# Patient Record
Sex: Female | Born: 1945 | Race: White | Hispanic: No | Marital: Single
Health system: Southern US, Community
[De-identification: ages and names within clinical notes are randomized; demographics above are authoritative.]

---

## 2011-08-19 ENCOUNTER — Inpatient Hospital Stay: Payer: Self-pay | Admitting: Internal Medicine

## 2011-08-19 LAB — DRUG SCREEN, URINE
Amphetamines, Ur Screen: NEGATIVE (ref ?–1000)
Barbiturates, Ur Screen: NEGATIVE (ref ?–200)
Cocaine Metabolite,Ur ~~LOC~~: NEGATIVE (ref ?–300)
MDMA (Ecstasy)Ur Screen: NEGATIVE (ref ?–500)
Methadone, Ur Screen: NEGATIVE (ref ?–300)
Opiate, Ur Screen: NEGATIVE (ref ?–300)
Tricyclic, Ur Screen: NEGATIVE (ref ?–1000)

## 2011-08-19 LAB — URINALYSIS, COMPLETE
Blood: NEGATIVE
Glucose,UR: NEGATIVE mg/dL (ref 0–75)
Nitrite: NEGATIVE
Ph: 7 (ref 4.5–8.0)
Specific Gravity: 1.018 (ref 1.003–1.030)
Squamous Epithelial: 1

## 2011-08-19 LAB — COMPREHENSIVE METABOLIC PANEL
Albumin: 3.4 g/dL (ref 3.4–5.0)
Alkaline Phosphatase: 183 U/L — ABNORMAL HIGH (ref 50–136)
Anion Gap: 8 (ref 7–16)
BUN: 12 mg/dL (ref 7–18)
Bilirubin,Total: 3.4 mg/dL — ABNORMAL HIGH (ref 0.2–1.0)
Chloride: 103 mmol/L (ref 98–107)
Co2: 30 mmol/L (ref 21–32)
Creatinine: 0.61 mg/dL (ref 0.60–1.30)
EGFR (African American): >60
EGFR (Non-African Amer.): >60
Glucose: 73 mg/dL (ref 65–99)
Osmolality: 280 (ref 275–301)
Potassium: 4.1 mmol/L (ref 3.5–5.1)
SGOT(AST): 81 U/L — ABNORMAL HIGH (ref 15–37)
SGPT (ALT): 46 U/L
Total Protein: 7.1 g/dL (ref 6.4–8.2)

## 2011-08-19 LAB — ETHANOL: Ethanol: <3 mg/dL

## 2011-08-19 LAB — CBC
HGB: 14 g/dL (ref 12.0–16.0)
MCH: 37.3 pg — ABNORMAL HIGH (ref 26.0–34.0)
MCHC: 34.3 g/dL (ref 32.0–36.0)
MCV: 109 fL — ABNORMAL HIGH (ref 80–100)
Platelet: 104 10*3/uL — ABNORMAL LOW (ref 150–440)
RDW: 15.1 % — ABNORMAL HIGH (ref 11.5–14.5)
WBC: 5 10*3/uL (ref 3.6–11.0)

## 2011-08-19 LAB — AMMONIA: Ammonia, Plasma: 40 mcmol/L — ABNORMAL HIGH (ref 11–32)

## 2011-08-20 LAB — COMPREHENSIVE METABOLIC PANEL
Albumin: 2.6 g/dL — ABNORMAL LOW (ref 3.4–5.0)
Alkaline Phosphatase: 113 U/L (ref 50–136)
Anion Gap: 10 (ref 7–16)
BUN: 8 mg/dL (ref 7–18)
Chloride: 105 mmol/L (ref 98–107)
Creatinine: 0.53 mg/dL — ABNORMAL LOW (ref 0.60–1.30)
Osmolality: 276 (ref 275–301)
SGOT(AST): 63 U/L — ABNORMAL HIGH (ref 15–37)
Sodium: 140 mmol/L (ref 136–145)
Total Protein: 5.6 g/dL — ABNORMAL LOW (ref 6.4–8.2)

## 2011-08-20 LAB — LIPID PANEL
Ldl Cholesterol, Calc: 41 mg/dL (ref 0–100)
Triglycerides: 67 mg/dL (ref 0–200)
VLDL Cholesterol, Calc: 13 mg/dL (ref 5–40)

## 2011-08-21 LAB — PROTIME-INR: INR: 1.5

## 2011-08-22 LAB — HEPATIC FUNCTION PANEL A (ARMC)
Alkaline Phosphatase: 117 U/L (ref 50–136)
SGOT(AST): 50 U/L — ABNORMAL HIGH (ref 15–37)
SGPT (ALT): 27 U/L
Total Protein: 4.7 g/dL — ABNORMAL LOW (ref 6.4–8.2)

## 2011-08-22 LAB — PROTIME-INR
INR: 1.5
Prothrombin Time: 18.7 secs — ABNORMAL HIGH (ref 11.5–14.7)

## 2011-08-22 LAB — AMMONIA: Ammonia, Plasma: 73 mcmol/L — ABNORMAL HIGH (ref 11–32)

## 2011-08-24 LAB — HEPATIC FUNCTION PANEL A (ARMC)
Albumin: 2.3 g/dL — ABNORMAL LOW (ref 3.4–5.0)
Bilirubin, Direct: 1.2 mg/dL — ABNORMAL HIGH (ref 0.00–0.20)
Total Protein: 5.3 g/dL — ABNORMAL LOW (ref 6.4–8.2)

## 2011-08-24 LAB — AMMONIA: Ammonia, Plasma: 29 mcmol/L (ref 11–32)

## 2011-08-24 LAB — PROTIME-INR: INR: 1.5

## 2011-08-26 LAB — CBC WITH DIFFERENTIAL/PLATELET
Basophil %: 0.3 %
Eosinophil #: 0.1 10*3/uL (ref 0.0–0.7)
Eosinophil %: 1.9 %
HCT: 39.5 % (ref 35.0–47.0)
HGB: 13.5 g/dL (ref 12.0–16.0)
Lymphocyte %: 20.3 %
Neutrophil %: 66.5 %
Platelet: 92 10*3/uL — ABNORMAL LOW (ref 150–440)
WBC: 5.3 10*3/uL (ref 3.6–11.0)

## 2011-08-26 LAB — AMMONIA: Ammonia, Plasma: 38 mcmol/L — ABNORMAL HIGH (ref 11–32)

## 2011-08-30 LAB — CBC WITH DIFFERENTIAL/PLATELET
Basophil #: 0 10*3/uL (ref 0.0–0.1)
Basophil %: 0.2 %
Eosinophil #: 0.2 10*3/uL (ref 0.0–0.7)
Eosinophil %: 4.5 %
HCT: 31.9 % — ABNORMAL LOW (ref 35.0–47.0)
HGB: 10.9 g/dL — ABNORMAL LOW (ref 12.0–16.0)
MCH: 37.7 pg — ABNORMAL HIGH (ref 26.0–34.0)
MCV: 110 fL — ABNORMAL HIGH (ref 80–100)
Neutrophil #: 3.3 10*3/uL (ref 1.4–6.5)
Neutrophil %: 61.2 %
RBC: 2.89 10*6/uL — ABNORMAL LOW (ref 3.80–5.20)

## 2011-08-30 LAB — AMMONIA: Ammonia, Plasma: 69 mcmol/L — ABNORMAL HIGH (ref 11–32)

## 2011-08-30 LAB — BASIC METABOLIC PANEL
Calcium, Total: 8.9 mg/dL (ref 8.5–10.1)
Co2: 31 mmol/L (ref 21–32)
EGFR (African American): >60
EGFR (Non-African Amer.): >60
Glucose: 76 mg/dL (ref 65–99)
Osmolality: 282 (ref 275–301)
Potassium: 4.1 mmol/L (ref 3.5–5.1)

## 2011-08-31 LAB — URINALYSIS, COMPLETE
Bilirubin,UR: NEGATIVE
Glucose,UR: NEGATIVE mg/dL (ref 0–75)
Nitrite: NEGATIVE
RBC,UR: 396 /HPF (ref 0–5)
Specific Gravity: 1.028 (ref 1.003–1.030)
Squamous Epithelial: <1

## 2011-09-02 LAB — COMPREHENSIVE METABOLIC PANEL
Albumin: 2.2 g/dL — ABNORMAL LOW (ref 3.4–5.0)
Alkaline Phosphatase: 120 U/L (ref 50–136)
Bilirubin,Total: 2 mg/dL — ABNORMAL HIGH (ref 0.2–1.0)
Calcium, Total: 8.7 mg/dL (ref 8.5–10.1)
Co2: 32 mmol/L (ref 21–32)
EGFR (Non-African Amer.): >60
Glucose: 84 mg/dL (ref 65–99)
Osmolality: 285 (ref 275–301)
SGOT(AST): 63 U/L — ABNORMAL HIGH (ref 15–37)
SGPT (ALT): 34 U/L
Sodium: 144 mmol/L (ref 136–145)

## 2011-09-02 LAB — CBC WITH DIFFERENTIAL/PLATELET
Basophil #: 0 10*3/uL (ref 0.0–0.1)
Eosinophil #: 0.3 10*3/uL (ref 0.0–0.7)
Eosinophil %: 5.6 %
HCT: 31.4 % — ABNORMAL LOW (ref 35.0–47.0)
Lymphocyte #: 1.1 10*3/uL (ref 1.0–3.6)
Lymphocyte %: 22.1 %
MCH: 39.7 pg — ABNORMAL HIGH (ref 26.0–34.0)
MCHC: 35.8 g/dL (ref 32.0–36.0)
MCV: 111 fL — ABNORMAL HIGH (ref 80–100)
Monocyte #: 0.5 10*3/uL (ref 0.0–0.7)
Neutrophil #: 3.1 10*3/uL (ref 1.4–6.5)
RBC: 2.84 10*6/uL — ABNORMAL LOW (ref 3.80–5.20)
RDW: 15.7 % — ABNORMAL HIGH (ref 11.5–14.5)

## 2011-09-04 LAB — AMMONIA: Ammonia, Plasma: 62 mcmol/L — ABNORMAL HIGH (ref 11–32)

## 2012-01-08 ENCOUNTER — Ambulatory Visit: Payer: Self-pay | Admitting: Internal Medicine

## 2012-01-08 ENCOUNTER — Emergency Department: Payer: Self-pay | Admitting: Emergency Medicine

## 2012-01-13 ENCOUNTER — Emergency Department: Payer: Self-pay | Admitting: Emergency Medicine

## 2012-01-22 ENCOUNTER — Emergency Department: Payer: Self-pay | Admitting: Internal Medicine

## 2012-01-27 ENCOUNTER — Inpatient Hospital Stay: Payer: Self-pay | Admitting: Student

## 2012-01-27 LAB — CBC
HCT: 36.8 % (ref 35.0–47.0)
HGB: 12.6 g/dL (ref 12.0–16.0)
MCH: 34.4 pg — ABNORMAL HIGH (ref 26.0–34.0)
MCHC: 34.1 g/dL (ref 32.0–36.0)
MCV: 101 fL — ABNORMAL HIGH (ref 80–100)
Platelet: 121 10*3/uL — ABNORMAL LOW (ref 150–440)
RDW: 15.8 % — ABNORMAL HIGH (ref 11.5–14.5)
WBC: 6.3 10*3/uL (ref 3.6–11.0)

## 2012-01-27 LAB — COMPREHENSIVE METABOLIC PANEL
Albumin: 2.7 g/dL — ABNORMAL LOW (ref 3.4–5.0)
Alkaline Phosphatase: 457 U/L — ABNORMAL HIGH (ref 50–136)
Anion Gap: 8 (ref 7–16)
Calcium, Total: 9 mg/dL (ref 8.5–10.1)
Chloride: 106 mmol/L (ref 98–107)
Co2: 28 mmol/L (ref 21–32)
Creatinine: 0.77 mg/dL (ref 0.60–1.30)
EGFR (African American): >60
EGFR (Non-African Amer.): >60
Glucose: 87 mg/dL (ref 65–99)
Osmolality: 282 (ref 275–301)
Potassium: 3.6 mmol/L (ref 3.5–5.1)
SGPT (ALT): 47 U/L

## 2012-01-27 LAB — AMMONIA: Ammonia, Plasma: 92 mcmol/L — ABNORMAL HIGH (ref 11–32)

## 2012-01-27 LAB — PROTIME-INR
INR: 1.2
Prothrombin Time: 15.9 secs — ABNORMAL HIGH (ref 11.5–14.7)

## 2012-01-28 LAB — URINALYSIS, COMPLETE
Blood: NEGATIVE
Glucose,UR: NEGATIVE mg/dL (ref 0–75)
Ketone: NEGATIVE
Leukocyte Esterase: NEGATIVE
Ph: 6 (ref 4.5–8.0)
RBC,UR: 1 /HPF (ref 0–5)
Squamous Epithelial: 3

## 2012-01-29 LAB — COMPREHENSIVE METABOLIC PANEL
Albumin: 1.9 g/dL — ABNORMAL LOW (ref 3.4–5.0)
Anion Gap: 8 (ref 7–16)
BUN: 8 mg/dL (ref 7–18)
Calcium, Total: 8 mg/dL — ABNORMAL LOW (ref 8.5–10.1)
Chloride: 112 mmol/L — ABNORMAL HIGH (ref 98–107)
Co2: 26 mmol/L (ref 21–32)
Creatinine: 0.72 mg/dL (ref 0.60–1.30)
EGFR (Non-African Amer.): >60
Glucose: 66 mg/dL (ref 65–99)
Osmolality: 287 (ref 275–301)
Potassium: 3.6 mmol/L (ref 3.5–5.1)
SGOT(AST): 53 U/L — ABNORMAL HIGH (ref 15–37)
SGPT (ALT): 31 U/L
Sodium: 146 mmol/L — ABNORMAL HIGH (ref 136–145)
Total Protein: 4.8 g/dL — ABNORMAL LOW (ref 6.4–8.2)

## 2012-01-29 LAB — CBC WITH DIFFERENTIAL/PLATELET
Basophil #: 0 10*3/uL (ref 0.0–0.1)
Basophil %: 0.3 %
Eosinophil %: 4.9 %
HGB: 10.9 g/dL — ABNORMAL LOW (ref 12.0–16.0)
Lymphocyte #: 0.9 10*3/uL — ABNORMAL LOW (ref 1.0–3.6)
MCH: 34.8 pg — ABNORMAL HIGH (ref 26.0–34.0)
MCHC: 34.5 g/dL (ref 32.0–36.0)
Monocyte #: 0.4 x10 3/mm (ref 0.2–0.9)
Monocyte %: 10.8 %
Neutrophil #: 2.6 10*3/uL (ref 1.4–6.5)
Platelet: 98 10*3/uL — ABNORMAL LOW (ref 150–440)
RBC: 3.14 10*6/uL — ABNORMAL LOW (ref 3.80–5.20)
WBC: 4.2 10*3/uL (ref 3.6–11.0)

## 2012-01-29 LAB — AMMONIA: Ammonia, Plasma: 57 mcmol/L — ABNORMAL HIGH (ref 11–32)

## 2012-01-30 LAB — CBC WITH DIFFERENTIAL/PLATELET
Basophil #: 0 10*3/uL (ref 0.0–0.1)
Basophil %: 0.3 %
Eosinophil #: 0.2 10*3/uL (ref 0.0–0.7)
HCT: 32.6 % — ABNORMAL LOW (ref 35.0–47.0)
HGB: 11.1 g/dL — ABNORMAL LOW (ref 12.0–16.0)
Lymphocyte #: 1 10*3/uL (ref 1.0–3.6)
Lymphocyte %: 21.9 %
MCH: 34.3 pg — ABNORMAL HIGH (ref 26.0–34.0)
MCHC: 33.9 g/dL (ref 32.0–36.0)
MCV: 101 fL — ABNORMAL HIGH (ref 80–100)
Neutrophil #: 3 10*3/uL (ref 1.4–6.5)
RBC: 3.22 10*6/uL — ABNORMAL LOW (ref 3.80–5.20)
RDW: 15.7 % — ABNORMAL HIGH (ref 11.5–14.5)
WBC: 4.7 10*3/uL (ref 3.6–11.0)

## 2012-01-31 LAB — BASIC METABOLIC PANEL
Anion Gap: 7 (ref 7–16)
BUN: 7 mg/dL (ref 7–18)
Calcium, Total: 8.2 mg/dL — ABNORMAL LOW (ref 8.5–10.1)
Chloride: 108 mmol/L — ABNORMAL HIGH (ref 98–107)
Co2: 26 mmol/L (ref 21–32)
EGFR (African American): >60
EGFR (Non-African Amer.): >60
Osmolality: 278 (ref 275–301)
Potassium: 4.1 mmol/L (ref 3.5–5.1)
Sodium: 141 mmol/L (ref 136–145)

## 2012-01-31 LAB — TSH: Thyroid Stimulating Horm: 1.62 u[IU]/mL

## 2012-02-07 ENCOUNTER — Ambulatory Visit: Payer: Self-pay | Admitting: Internal Medicine

## 2012-03-07 ENCOUNTER — Ambulatory Visit: Payer: Self-pay | Admitting: Family Medicine

## 2012-03-09 LAB — AMMONIA

## 2012-07-02 ENCOUNTER — Ambulatory Visit: Payer: Self-pay | Admitting: Medical

## 2012-08-09 DEATH — deceased

## 2014-04-07 IMAGING — CT CT HEAD WITHOUT CONTRAST
2 series · 16 of 30 positions shown, 20 images · non-contrast
Comparison: none

REASON FOR EXAM: confused
COMMENTS:

PROCEDURE:     CT  - CT HEAD WITHOUT CONTRAST  - January 27, 2012  [DATE]
RESULT:
HISTORY: Confusion.
Comparison Study: No recent.

[Series 2: without · axial · non-contrast · 0.41mm/px · z∈[+386,+506]mm · 13 of 30 slices shown, 17 images]
[im 3/30  brain]
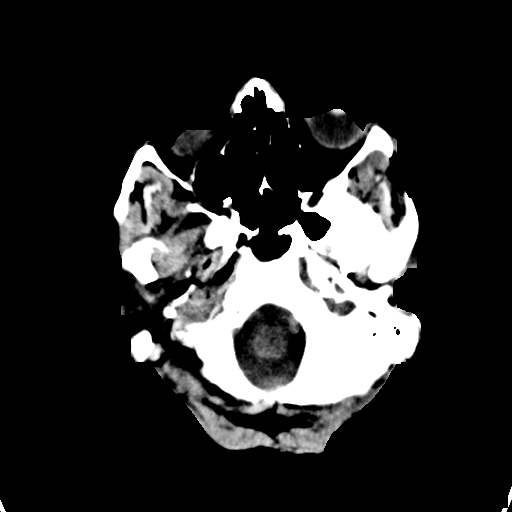
[im 3/30  bone]
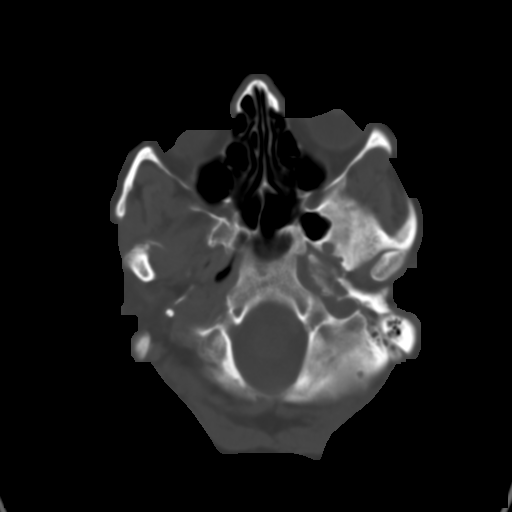
[im 5/30  brain]
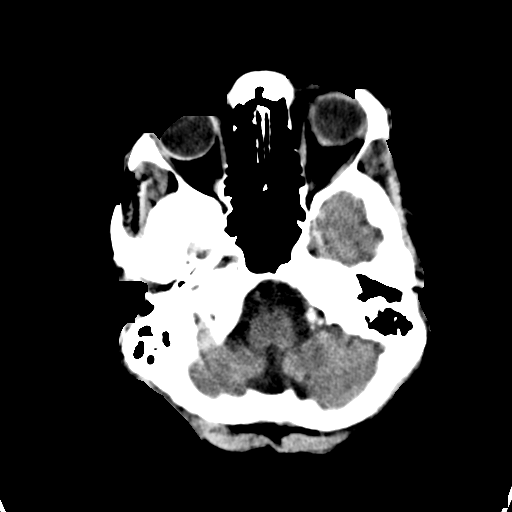
[im 7/30  brain]
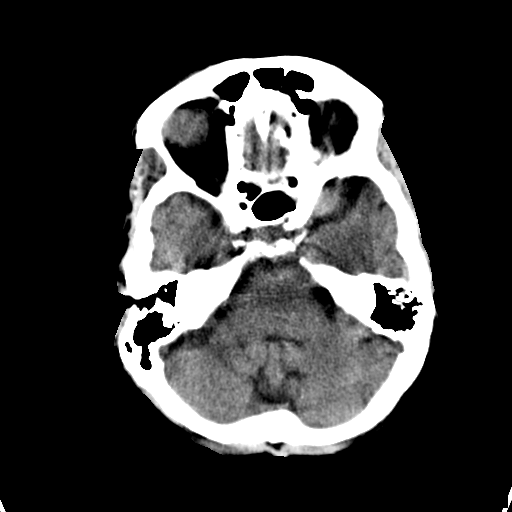
[im 9/30  brain]
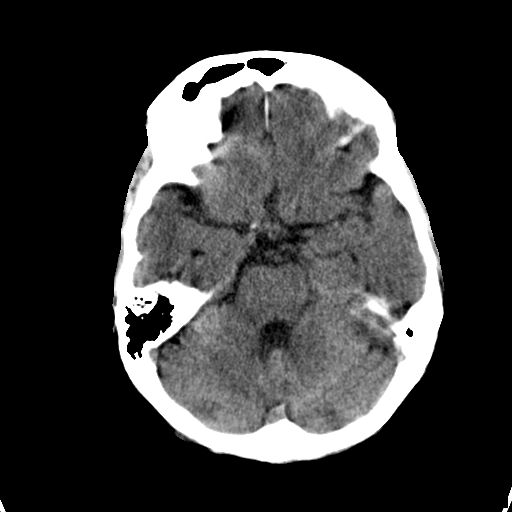
[im 11/30  brain]
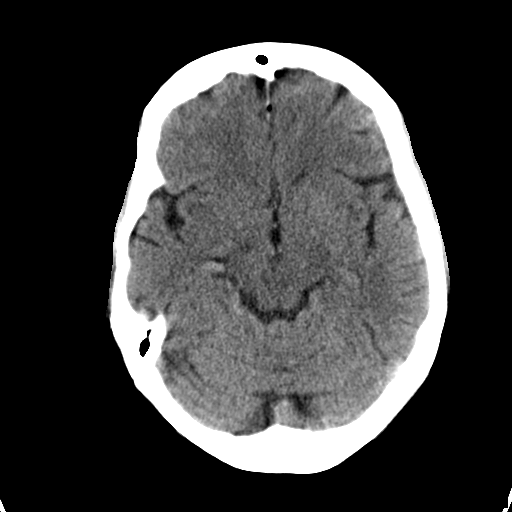
[im 11/30  bone]
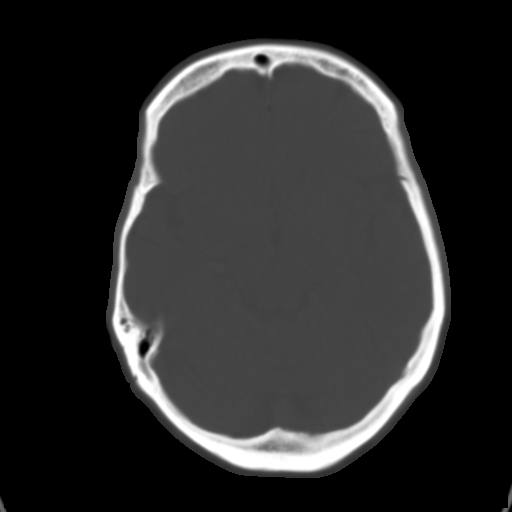
[im 13/30  brain]
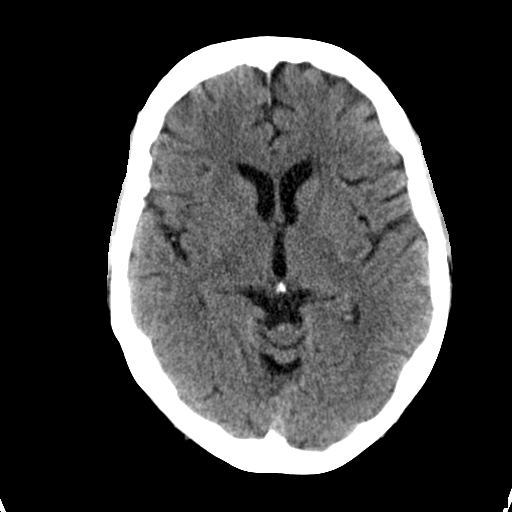
[im 15/30  brain]
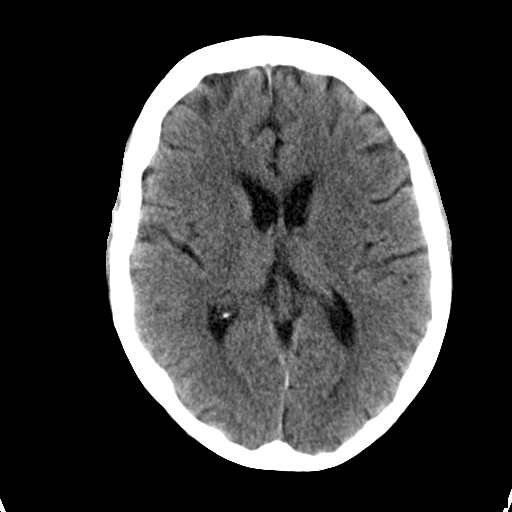
[im 17/30  brain]
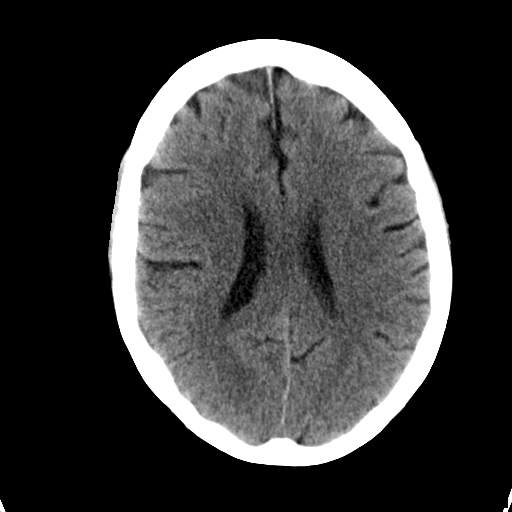
[im 19/30  brain]
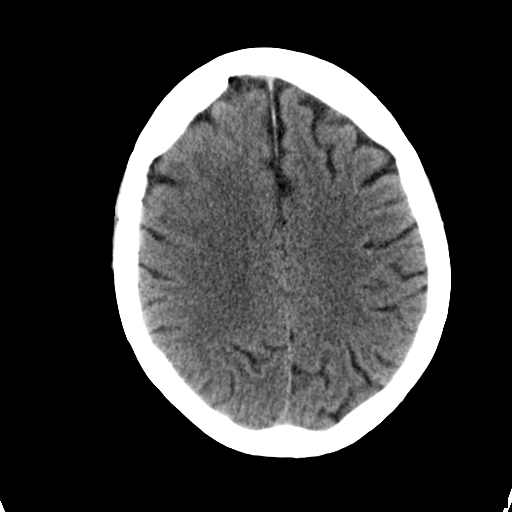
[im 19/30  bone]
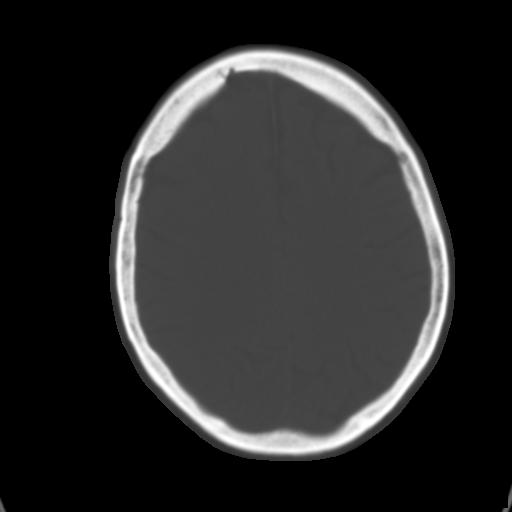
[im 21/30  brain]
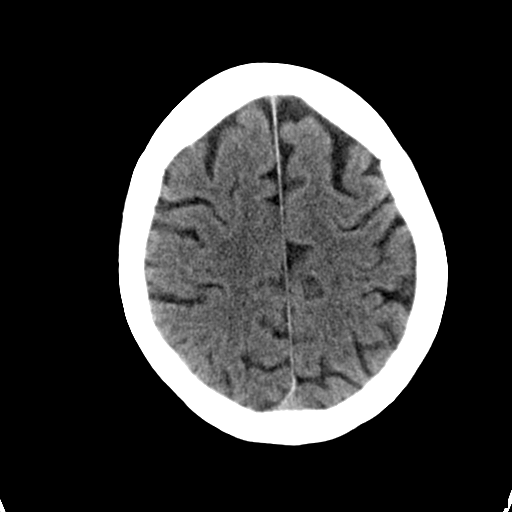
[im 23/30  brain]
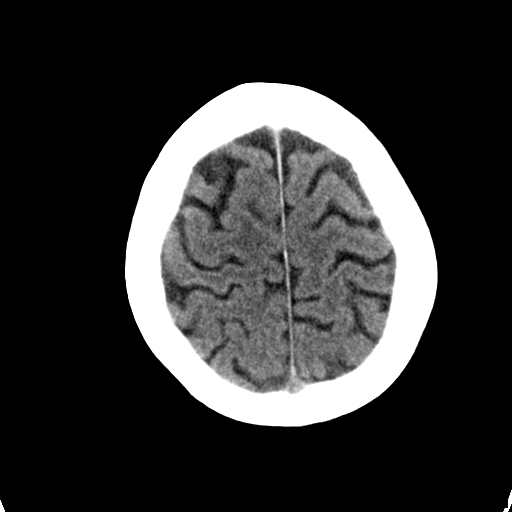
[im 25/30  brain]
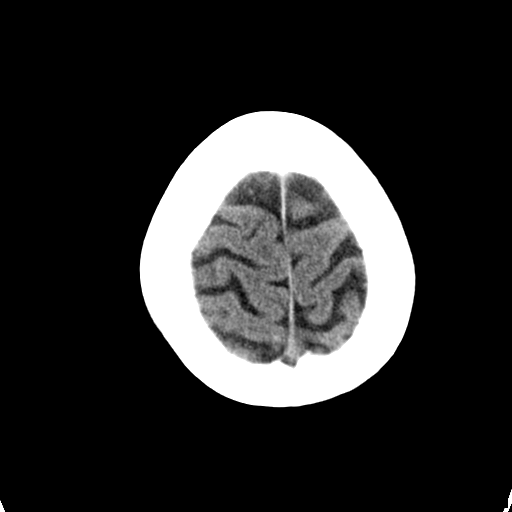
[im 27/30  brain]
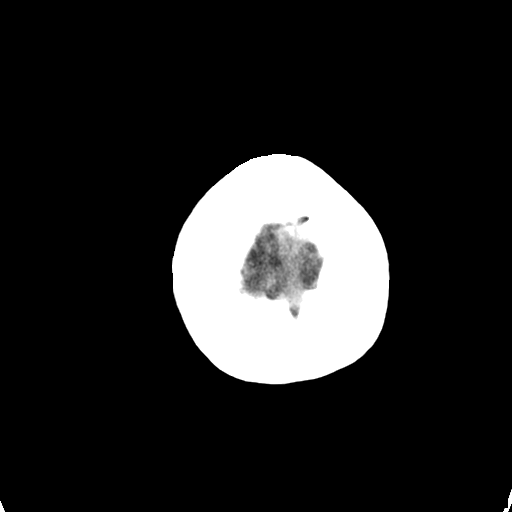
[im 27/30  bone]
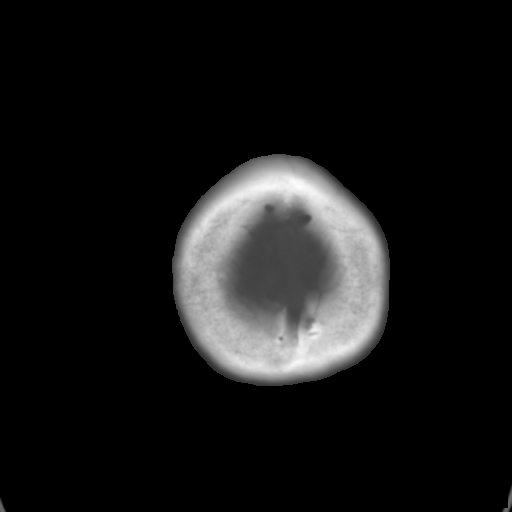

[Series 3: bone · axial · 0.41mm/px · z∈[+386,+426]mm · 3 of 30 slices shown]
[im 3/30  bone]
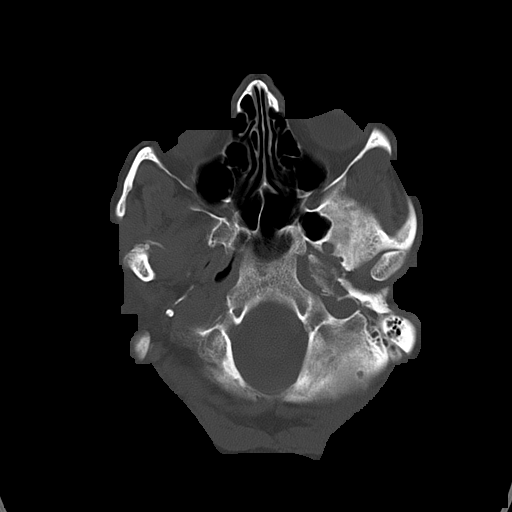
[im 7/30  bone]
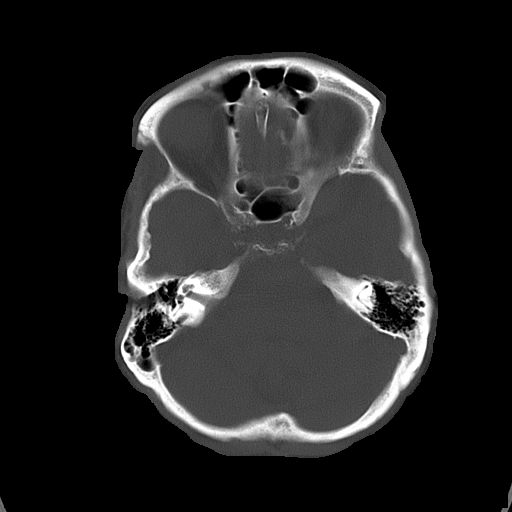
[im 11/30  bone]
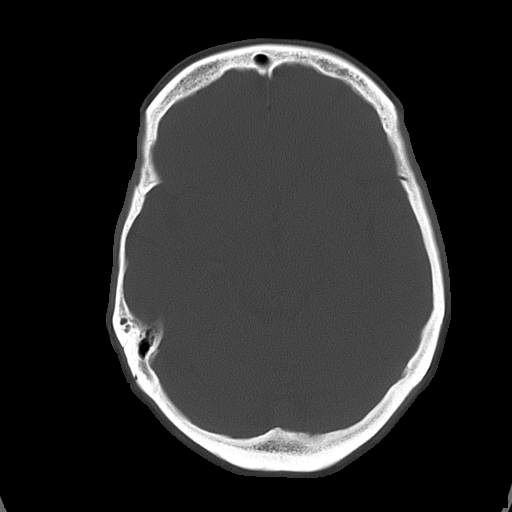

[16 of 30 positions shown; findings below may reference images not displayed]

FINDINGS: Standard nonenhanced CT obtained. No mass. No hydrocephalus. No
hemorrhage. No acute bony abnormality. Head CT is stable from 08/19/2011.
IMPRESSION: No acute abnormality.

## 2014-12-01 NOTE — Consult Note (Signed)
PATIENT NAME:  Cindy Leon, BLACKSTON MR#:  [id number] DATE OF BIRTH:  1946/07/29  DATE OF CONSULTATION:  08/21/2011  REFERRING PHYSICIAN:   CONSULTING PHYSICIAN:  Christena Deem, MD  HISTORY OF PRESENT ILLNESS: Cindy Leon is a 69 year old Caucasian female who was admitted to the hospital on 08/18/2001 with complaints of abdominal pain, confusion, and depression. She has a history of hepatitis C. Interviewing Cindy Leon was very difficult as I believe she is mildly encephalopathic currently and I am uncertain as to whether her facts are correct. There was no family member present. Apparently her sister brought her to the hospital because of complaints of abdominal pain and apparently confusion. She apparently has had hepatitis C for some time, but variably states different intervals. At one point she stated that she has had problems with ammonia levels in the past and the next moment she could not tell me. Her abdominal pain is apparently epigastric and/or right upper quadrant to generalized in nature. She is uncertain whether she has it on a regular basis. There is no nausea or vomiting. She has a bowel movement once or twice a day. She may have been on lactulose at home at one point. She was on Xifaxan 550 mg twice a day apparently as an outpatient. She is unsure of any of her outpatient physicians names. She does deny that she has ever had problems with abdominal swelling/ascites.  LIVER HISTORY: No tattoos. Questionable history of transfusion. 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 She states that she quit in the 1980s. There is no history of military, foreign travel, or incarceration. She states that her brother has a history of some sort of liver disease this sounding very much like chronic hepatitis C. Her father died from complications of alcohol-related liver disease.   PAST MEDICAL HISTORY:  1. History of cervical  cancer. 2. Hepatitis C, as stated.  3. History of hepatic encephalopathy. 4. History of cervical surgeries/removal.   OUTPATIENT MEDICATIONS:  1. Calcium carbonate 500 mg one twice a day. 2. Celexa 10 mg once a day. 3. Oxybutynin 5 mg twice a day. 4. Spironolactone 25 mg once a day. 5. Vitamin D3 once a day. 6. Xifaxan 550 mg twice a day. 7. Zantac 150 mg twice a day.   ALLERGIES: She is allergic to amoxicillin and latex.   REVIEW OF SYSTEMS: Per History and Physical; however, in addition she denies any blood in her stools or black stools, any nausea or vomiting, problems with heartburn or dysphagia.   SOCIAL HISTORY: Currently the patient denies use of alcohol, illicit drugs, or tobacco. She currently lives with her sister who is also taking care of a son who has problems with cancer as well.   PHYSICAL EXAMINATION:   VITAL SIGNS: Temperature 98.6, pulse 74, respirations 18, blood pressure 105/62, and pulse oximetry 94%.   GENERAL: She is a very tired appearing 69 year old Caucasian female in no acute distress. There is some mild altered mental status, as noted above.   HEAD: Normocephalic, atraumatic.   EYES: Mild icterus.   NOSE: Septum midline.   OROPHARYNX: Poor dentition.   NECK: Supple. No JVD.   HEART: Regular rate and rhythm without rub or gallop.   LUNGS: Bilaterally clear.  ABDOMEN: Soft, nondistended. Bowel sounds positive, normoactive. It is difficult to ascertain discomfort to examination. I believe there is a mild generalized discomfort. She relates more so discomfort in  the epigastric region and right upper quadrant then will point to the left upper quadrant and lower abdomen. There is certainly no evidence of rebound or acute abdomen.   RECTAL: Anorectal exam is deferred.   EXTREMITIES: No clubbing, cyanosis, or edema.   NEUROLOGICAL: Cranial nerves II through XII grossly intact. Muscle strength bilaterally equal and symmetric. Deep tendon reflexes  bilaterally equal and symmetric.   LABS/STUDIES: On admission to the hospital labs showed a BUN of 12 and creatinine of 0.61. She had an ethanol of less than 3. Her plasma ammonia on admission was 40 and this on a scale of 11 to 32. Her hepatic profile shows a total protein of 7.1, albumin 3.4, total bilirubin 3.4, alkaline phosphatase 183, AST 81, and ALT 46. She had a single troponin I done at 0.02. Urine drug screen was negative. Her hemogram showed a white count of 5.0, hemoglobin and hematocrit 14.0 and 40.7, platelet count 104, and MCV is 109.   She had a urine culture showing greater than 100,000 organisms with predominance of gram-negative rods.   Her urinalysis however was negative for nitrite and trace leukocyte esterase.   Blood cultures were no growth in 36 hours.   Repeat labs yesterday showed the total protein at 5.6, albumin 2.6, total bilirubin 4.2, alkaline phosphatase 113, AST 63, and ALT 34. I obtained a pro time today that was 18.6 and INR 1.5.   She has had a CT scan of the head without contrast showing no acute intracranial abnormality. There are some chronic changes consistent with chronic small vessel ischemia disease.   Her CT scan of the abdomen and pelvis showed a moderate-sized right hepatic well-circumscribed low attenuation area suggestive of a cyst. Spleen upper limits of normal. The gallbladder is normal. Atrophic uterus. No adnexal masses. Moderate amount of fecal material.   ASSESSMENT:  1. 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 This is mainly by current history. I am unaware as to the extent of her previous liver evaluation as she apparently only recently moved to this area from [place]. She has not been treated for hepatitis C apparently in the past. She presents with manifestations of coagulopathy, hepatic encephalopathy/hyperammonemia as well as abnormal liver testing including hyperbilirubinemia.  She currently does show a mild hepatic encephalopathy.  2. Macrocytosis. This raises a question of B12 or folate deficiency in current setting.  3. Urinary tract infection noted on admission to the hospital, currently on Cipro for this.   RECOMMENDATIONS:  1. Continue current medications. I am going to increase her lactulose to four times a day. She should be having between 2 to 3 bowel movements daily. She has only had one bowel movement since her hospitalization. Hopefully this will help her to clear some of the ammonia and mental status changes.  2. Obtain serum B12, RBC, and folate.  3. In regards to the abnormal CT findings, this is worrisome in the setting of chronic hepatitis C for possible neoplasia in the liver.   We will obtain an alpha-fetoprotein. We will follow with you. ____________________________ Christena DeemMartin U. Ender Rorke, MD mus:slb D: 08/21/2011 16:00:31 ET T: 08/22/2011 09:38:28 ET JOB#: [id number]  cc: Christena DeemMartin U. Kenden Brandt, MD, <Dictator> Christena DeemMARTIN U Myelle Poteat MD ELECTRONICALLY SIGNED 08/31/2011 17:27

## 2014-12-01 NOTE — Consult Note (Signed)
PATIENT NAME:  Cindy Leon, Cindy Leon MR#:  [id number] DATE OF BIRTH:  08-27-1945  DATE OF CONSULTATION:  08/24/2011  REFERRING PHYSICIAN:  Dr. Alwyn Pea  CONSULTING PHYSICIAN:  Adelene Amas. Natalynn Pedone, MD  REASON FOR CONSULTATION: Psychosis, agitation and cloudiness of consciousness.   CHIEF COMPLAINT: See the reason for consultation.   HISTORY OF PRESENT ILLNESS: Ms. Airel Magadan is a 69 year old female admitted to the [facility] on 08/19/2011 for abdominal pain, depressed mood and confusion.   She has a number of factors involving her general medical status including hepatitis C with abdominal pain. She became confused approximately three weeks ago and this progressed to the point of some impairment in memory and some thought disorganization at the time of admission. She also had her relayed to her younger sister that she felt depressed mood.   Other stresses include her son's diagnosis with cancer. She has a history of hepatic encephalopathy with her hepatitis C in the past. Prior to hospitalization Ms. Kraner was relaying to her family that she was having visual hallucinations.   By the day of the undersigned's consultation she had slight clouding of consciousness, however, the most apparent feature is paranoia. Please see the mental status exam. Nurses have reported severe paranoia. The patient has been refusing medication even with her sister trying to facilitate the oral form of medication. She also has displayed severe agitation along with confusion.   PAST PSYCHIATRIC HISTORY: Ms. Gorr does have prior history of hepatic encephalopathy. She has experienced depression in the past and was admitted on Celexa 10 mg daily. But there is no known history of mania or primary psychosis. She has not been psychiatrically hospitalized.    FAMILY PSYCHIATRIC HISTORY: None known.   SOCIAL HISTORY: Ms. Hesse is single. She does not use illegal drugs or alcohol. She has a younger  sister who is very supportive and at the bedside. The patient lives with her younger sister and her son who has cancer. Ms. Grein is unemployed and disabled.   PAST MEDICAL HISTORY:  1. History of cervical cancer. 2. Hepatitis C with history of hepatic encephalopathy. 3. History of surgery on the cervix.   ALLERGIES: Amoxicillin, latex.   MEDICATIONS: Her MAR is reviewed. She is refusing oral medication, however, Celexa 20 mg is on the MAR, Risperdal 1 mg b.i.d. is on the [facility].   LABORATORY, DIAGNOSTIC AND RADIOLOGICAL DATA: SGOT 50, total protein decreased at 4.7, albumin decreased at 2.1 direct bilirubin increased at 0.9. Total bilirubin increased at 1.8, ammonia level increased on 01/13 at 73, RBC folate was unremarkable. B12 unremarkable. Of note her ammonia was 43 on 01/11, possibly showing a significant increase.   Head CT without contrast showed no acute intracranial abnormality. EKG on 01/10 showed the QTc to be 444 ms. Ethanol was negative. BUN and creatinine normal. WBC normal. Hemoglobin normal. Platelet count decreased at 104.    REVIEW OF SYSTEMS: The patient was not able to provide this. Much was gleaned from the medical record; constitutional, HEENT, mouth, neurologic, psychiatric, cardiovascular, respiratory, gastrointestinal, genitourinary, skin, musculoskeletal, hematologic, lymphatic, endocrine, metabolic all unremarkable.   PHYSICAL EXAMINATION:  VITAL SIGNS: Temperature 98, pulse 85, respiratory rate 18, blood pressure 112/58.   GENERAL APPEARANCE: Ms. Tiegs is a mildly cachectic elderly female lying in a supine position in her hospital bed with no abnormal involuntary movements. Her muscle tone appears to be mild to moderately decreased. She is well groomed with normal hygiene.   Her alertness is mildly  decreased. Her eye contact is only intermittent. Her concentration is moderately decreased. When asked about orientation she cannot give place or time. She is oriented to  person. On memory questions she names zero out of three words immediate and zero out of three words at five minutes. Her use of language, intelligence and fund of knowledge are severely impaired. When given the sentence "It is a sunny day in [place]" she could not repeat that sentence.   Her speech is very impoverished. There is no dysarthria. Her phrases are limited to "I don't know". Then she will suddenly burst out with angry affect and say "who are you", "what do you have to do with me" with her affect severely guarded and thought content obviously involving paranoia. She will not provide any additional information. Thought process does appear to be coherent when she is willing to speak. Mood is a mixture of anger and depression. Insight is poor. Judgment is impaired.   ASSESSMENT:  AXIS I:  1. Delirium due to hepatic encephalopathy.  2. Depressive disorder, not otherwise specified. This may be major depression, however, her delirium symptoms are likely strictly due to her general medical condition.   AXIS II: Deferred.   AXIS III: Hepatitis C with hepatic encephalopathy. Please see the above.   AXIS IV: General medical, primary support group.   AXIS V: 15.   The undersigned discussed medication and the method of delivery with Ms. Yearsley's younger sister. Her younger sister concurs with below.   RECOMMENDATIONS:  Given that the patient had a QTc of 444 ms on her EKG and Zyprexa can be given in standard pill form, easily dissolving Zydis, or IM, would change from Risperdal to Zyprexa for anti- psychosis.   Also Zyprexa can augment in the treatment of anti-depression.   Concur with the current dosage of Celexa for anti-depression. Further evaluation and treatment of residual depression can occur once delirium is cleared.   General medical treatment is underway with lactulose to reduce ammonia, also subspecialty consultation for her hepatic condition is underway.   Regarding the  ordering of Zyprexa specifically, would start Zyprexa at 5 mg p.o. Zydis 2:00 p.m. with the first dose as soon as possible. Would make the order p.o. or 5 mg IM. The undersigned will enter that order and cancel the Risperdal.   For anti-acute agitation Ativan can be utilized if the agitation is so severe the combativeness is present or the agitation is undermining critical care. Ativan is only conjugated by the liver and has no active metabolites therefore it can be used with a relatively low risk of confounding delirium. If Ativan becomes necessary would utilize 0.5 to 1 mg p.o. IM or IV q.4 p.r.n. as the initial trial dose.   Additional discussion of Zyprexa: Zyprexa requires little oxidation for its metabolism by the liver therefore it is a favorable antipsychotic in this context.   Other recommendation: Would continue to reinforce orientation and keep stimulation as low as possible in the room.   ____________________________ Adelene AmasJames S. Avis Mcmahill, MD jsw:cms D: 08/24/2011 14:05:00 ET T: 08/24/2011 14:52:46 ET JOB#: [id number]  cc: Adelene AmasJames S. Paitynn Mikus, MD, <Dictator> Lester CarolinaJAMES S Jeannette Maddy MD ELECTRONICALLY SIGNED 09/03/2011 19:28

## 2014-12-01 NOTE — Discharge Summary (Signed)
PATIENT NAME:  Cindy Leon, Cindy Leon MR#:  [id number] DATE OF BIRTH:  1945/09/01  DATE OF ADMISSION:  08/19/2011 DATE OF DISCHARGE:  08/27/2011  DISPOSITION: To Behavioral Health   DISCHARGE DIAGNOSES:  1. Altered mental status with hepatic encephalopathy, resolved.  2. Chronic hepatitis C  CHild [facility]GH class B cirrhosis.  3. History of depression and agitation.   MEDICATIONS:  1. Ranitidine 150 mg p.o. b.i.d.  2. Xifaxan 550 mg b.i.d.  3. Aldactone 25 mg daily with meals. 4. Lactulose 60 mg p.o. b.i.d.  5. Celexa 10 mg daily. 6. Zyprexa 5 mg IM daily.  CONSULTATIONS:  1. Psychiatric consult with Dr. Antonietta BreachJames Williford.  2. GI consult with Dr. Marva PandaSkulskie.   HOSPITAL COURSE: Patient is a 69 year old female patient with history of hep C came in for confusion and also abdominal pain. Look at the history and physical for full details. Patient also mentioned she is worried about her sister's son diagnosed with cancer. Patient's ammonia level was 40 on admission. She is admnoted also hiitted for altered mental status with urinary tract infection and urine culture showed Escherichia coli sensitive to Cipro. Patient finished seven days of Cipro so far and that can be discontinued. Patient's white count was normal on admission. Renal function was also normal. She was started on antibiotics, initially was on Rocephin but changed to Cipro later according to sensitivities. Patient also started on lactulose. Seen by Dr. Marva PandaSkulskie, GI, and her urine toxicology has been negative. She had CAT scan of the abdomen done which showed possible constipation with renal and hepatic cyst and also  story of cyst in the liver.   Patient's AFP showed 2.5. Ultrasound was done of abdomen which showed tip shunt is patent and patient had gallbladder wall thickening about 5 mm thick. Patient has been started on increased dose of lactulose and Xifaxan. Patient's ammonia levels were elevated on 01/13 to 73 so lactulose has been increased  to 60 q.i.d. and ammonia levels came back down to 29 on 01/15, however, patient started to refuse the medications as well as physical examination so because of this change in behavior psychiatric consult obtained with Dr. Antonietta BreachJames Williford and they thought it could be delirium with underlying depression, also due to underlying hepatic encephalopathy. Patient's ammonia level was completely normal even though she refused physical examination and also she was agitated so she was started on Celexa and Risperdal along with p.Leon.n. Ativan. Patient has been refusing medications with the nurses but she is taking medicines only when the sister gives and has been evaluated by Dr. Antonietta BreachJames Williford on regular basis. Right now patient is medically stable. Her hepatic encephalopathy is resolved and she needs to go to psychiatry ward. [facility] her [facility] so she will be going down there for further treatment. Discussed the plan with patient's sister, Zella BallRobin. Patient's other labs include WBC yesterday 5.3, hemoglobin 13.7, hematocrit 39.5, platelets 92. Ammonia level yesterday 38, total bilirubin 2.4, direct 1.2, alkaline phosphatase 82, ALT 33, AST 61, albumin 2.3. Patient's condition is stable at this time. Her blood pressure is 119/45, pulse 106, respirations 18, temperature 98 and sats 95% on room air.   CONDITION ON DISCHARGE: Condition is stable.   TIME SPENT ON DISCHARGE PREPARATION: More than 30 minutes.  ____________________________ Katha HammingSnehalatha Marshelle Bilger, MD sk:cms D: 08/27/2011 12:38:33 ET T: 08/27/2011 12:47:08 ET JOB#: [id number]  cc: Katha HammingSnehalatha Zuriah Bordas, MD, <Dictator> Katha HammingSNEHALATHA Milford Cilento MD ELECTRONICALLY SIGNED 09/21/2011 14:55

## 2014-12-01 NOTE — H&P (Signed)
PATIENT NAME:  Cindy DerryCASPER, Cindy Leon MR#:  [id number] DATE OF BIRTH:  [birth year]  DATE OF ADMISSION:  08/19/2011  ADDENDUM:  ASSESSMENT AND PLAN: Worsening depression. Will place the patient on suicide precautions and obtain Psychiatry consultation.   ____________________________ Donia AstJignesh S. Claudina Oliphant, MD jsp:drc D: 08/20/2011 00:32:17 ET T: 08/20/2011 07:32:11 ET JOB#: [id number]  cc: Donia AstJignesh S. Brixon Zhen, MD, <Dictator> Donia AstJIGNESH S Alphonsa Brickle MD ELECTRONICALLY SIGNED 08/20/2011 8:30

## 2014-12-01 NOTE — Discharge Summary (Signed)
PATIENT NAME:  Cindy Leon, Cindy Leon MR#:  [id number] DATE OF BIRTH:  04/10/1946  DATE OF ADMISSION:  01/27/2012 DATE OF DISCHARGE:  01/31/2012  CONSULTANTS:  1. Physical Therapy. 2. Laurette Schimke, NP - Palliative Care.   PRIMARY CARE PHYSICIAN: Clayborn Bigness, MD  CHIEF COMPLAINT: Abnormal labs.  DISCHARGE DIAGNOSES:  1. Hepatic encephalopathy with elevated ammonia superimposed on baseline dementia/confusion.  2. End-stage liver disease.   SECONDARY DIAGNOSES:  1. History of hepatic encephalopathy. 2. History of hepatitis C. 3. History of alcohol abuse.  4. Vitamin D deficiency. 5. Thrombocytopenia and anemia secondary to hepatitis C and liver disease.  6. End-stage liver disease. 7. Renal and hepatic cysts. 8. Depression, not otherwise specified. 9. Dementia, vascular and hepatic encephalopathy related.   DISCHARGE MEDICATIONS:  1. Tramadol 50 mg every six hours as needed for pain. 2. Zoloft 100 mg a day. 3. Spironolactone 25 mg a day.  4. Vitamin D3 400 international units 2 tabs once a day.  5. Oxybutynin 5 mg two times a day.  6. Xifaxan 550 mg 1 tab two times a day.  7. Ranitidine 150 mg two times a day.  8. Calcium/vitamin D 500/200 international units 1 tab two times a day. 9. Generlac 45 mL three times a day.  10. Olanzapine 7.5 mg once a day. 11. Analgesic Balm greaseless topical cream applied to topical effected areas every 8 hours as needed for pain.   DIET: Low sodium.   ACTIVITY: As tolerated.   REFERRALS: Outpatient physical therapy.   DISPOSITION: Transfer back to her assisted living facility.  CODE STATUS: DO NOT RESUSCITATE.   DISCHARGE FOLLOWUP: Please follow-up with your orthopedic doctor as previously scheduled this week. Please of follow-up with your primary care physician within one week and check ammonia level and a CBC.   HISTORY OF PRESENT ILLNESS: For full details of the history and physical, please see the dictation on 01/27/2012 by Dr.  Adrian Saran, but briefly this is a 69 year old female with hepatic encephalopathy and cirrhosis secondary to hepatitic C and dementia who presented after she was found with elevated ammonia level. Of note, the patient has had several falls recently with a wrist fracture and ankle sprain who was being followed as an outpatient by orthopedics. Here she was found to have elevated ammonia level and admitted to the hospitalist service for that.   SIGNIFICANT LABS AND IMAGING: Initial creatinine 0.77, sodium 142, potassium 3.6, albumin 2.7, total bilirubin 2.8, alkaline phosphatase 457, AST 73, and ALT 47. TSH 1.62. Initial ammonia was 92, then 79, and today, on the day of discharge, it is 51. Initial WBC 6.3 and hemoglobin 12.6. Hemoglobin by discharge 11.1, as of yesterday. Platelets on arrival 121 and last one being 91 from yesterday. Initial INR 1.2 and PT 15.9.   Urinalysis: 2+ bacteria, no nitrites or leukocyte esterase, 1 WBC.   CT of the head without contrast showed no acute abnormality.   HOSPITAL COURSE: The patient was admitted to the hospitalist service. As the ammonia level was high and the patient had recent falls, she was started on Rifaximin as well as 30 mL every 8 hours of lactulose. The patient did have bowel movements here and ammonia level trended down. The patient had a CT of the head which was negative for acute abnormalities. On further discussions with her caretakers and daughter, the patient does have significant dementia and confusion as an outpatient. The patient currently is at her baseline and her ammonia level has trended down  to 3551. She does have significant liver disease, which is end-stage per chart, and is on spironolactone, lactulose, and rifaximin. She initially was started on some IV fluids which has been discontinued as the patient has a good appetite. The patient does have recent right wrist fracture and right ankle sprain. The patient has a cast to the wrist and splint to  her ankle and is being followed by outpatient orthopedics and has an appointment this week, per her caretaker. Palliative care consult was obtained during this hospitalization and the patient is DNR currently. The patient was also seen by PT and is to be discharged home with services and physical therapy as an outpatient.   CODE STATUS: DO NOT RESUSCITATE.  TOTAL TIME SPENT: 35 minutes. ____________________________ Krystal EatonShayiq Brace Welte, MD sa:slb D: 01/31/2012 11:38:16 ET T: 01/31/2012 11:55:59 ET JOB#: [id number]  cc: Krystal EatonShayiq Emanuelle Hammerstrom, MD, <Dictator> Burley SaverL. Katherine Bliss, MD Krystal EatonSHAYIQ Dorell Gatlin MD ELECTRONICALLY SIGNED 02/12/2012 13:07

## 2014-12-01 NOTE — Discharge Summary (Signed)
PATIENT NAME:  Cindy Leon, Cindy Leon MR#:  [id number] DATE OF BIRTH:  [birth year]  DATE OF ADMISSION:  08/19/2011 DATE OF DISCHARGE:  09/04/2011  Please refer to previous discharge summaries by Dr. Luberta MutterKonidena and Dr. Chales AbrahamsGupta. This is the final discharge summary. Everything remains the same.  The only addendum I need to do is patient's - patient had been followed by Dr. Jeanie SewerWilliford from Psychiatry. Her discharge plan had been a little  challenging. I took care of the patient only for January 25 and September 04, 2011.   Staff from Behavioral Medicine reported this morning that patient does have [facility] bed [facility], and she has to be involuntary committed in order for the sheriff's department to transfer [facility]. I did talk with Ms. Zella BallRobin, patient's sister, and made her aware of the plan.  She had a question if she could take the patient; however, I said, per protocol, sheriff's department has to transfer her. I did fill out the IVC commitment papers which are valid for 24 hours. Since Dr. Guss Bundehalla was not going to be able to make it until late afternoon and did not want to interrupt the patient's transfer, I did talk to Dr. Guss Bundehalla about the feeling of the IVC papers, and she was okay with it.   The patient is going to be discharged. She is medically best at baseline.   Her discharge medications remain the same as above except please remove ciprofloxacin from her medication list. The patient did finish up her Cipro course for her Escherichia coli urinary tract infection.   TIME SPENT:  40 minutes.     ____________________________ Wylie HailSona A. Allena KatzPatel, MD sap:vtd D: 09/04/2011 11:17:44 ET T: 09/04/2011 13:08:41 ET JOB#: [id number]  cc: Mallie Giambra A. Allena KatzPatel, MD, <Dictator> Willow OraSONA A Harpreet Pompey MD ELECTRONICALLY SIGNED 09/11/2011 15:38

## 2014-12-01 NOTE — H&P (Signed)
PATIENT NAME:  Cindy Leon, Cindy Leon MR#:  [id number] DATE OF BIRTH:  30-Jan-1946  DATE OF ADMISSION:  01/27/2012  PRIMARY CARE PHYSICIAN: Dr. Quillian QuinceBliss   CHIEF COMPLAINT: Abnormal labs.   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 Aggie CosierBliss's office, had routine labs there. Apparently her  ammonia level was very elevated so she was sent here for further evaluation and a repeat ammonia level. Again the ammonia level was 93. The patient is confused. I did speak with her caregivers [facility]. They said that she is always confused. They have not noticed anything abnormal about her.  I did also try to call the patient's sister, who is also her power of attorney, for CODE STATUS but was unsuccessful and left a message.   REVIEW OF SYSTEMS: Unable to obtain as the patient is confused.   PAST MEDICAL HISTORY: 1. Hepatic encephalopathy.  2. Hepatitis C. 3. History of alcohol abuse.  4. Vitamin D deficiency.  5. Thrombocytopenia and anemia secondary to hepatitis C and liver disease.  6. Endstage liver disease.  7. Renal and hepatic cysts. 8. Depression not otherwise specified.  9. Dementia, vascular and hepatic encephalopathy related.   MEDICATIONS:  1. Analgesic balm to affected area every eight hours.  2. Calcium with vitamin D 1 tablet b.i.d.  3. Generlac  45 mL t.i.d.  4. Olanzapine 7.5 mg at bedtime.  5. Oxybutynin 5 mg b.i.d.  6. Ranitidine 150 b.i.d.  7. Zoloft 100 mg daily.  8. Spironolactone 25 mg daily. 9. Tramadol 50 mg every six hours p.Leon.n. pain.  10. Vitamin D3 400 international units 2 tablets daily.  11. Xifaxan 550 mg b.i.d.   ALLERGIES: Amoxicillin and latex.   PAST SURGICAL HISTORY: Cervix removal.   FAMILY HISTORY: Unknown.   SOCIAL HISTORY:   [facility]. Her power of attorney is Cindy Leon, her sister. Phone number [phone].  History of  polysubstance abuse according to the medical record.   PHYSICAL EXAMINATION:  VITAL SIGNS: Temperature 98.6, pulse 82, respirations 16, blood pressure 116/58, 99% on room air.   GENERAL: The patient is alert. She is oriented to her name but not place and time.   HEENT: Head is atraumatic. Pupils are round and reactive. Sclerae anicteric. Mucous membranes are moist.  Oropharynx is clear.    NECK: Supple without jugular venous distention, carotid bruit, or enlarged thyroid.   CARDIOVASCULAR: Regular rate and rhythm. No murmurs, gallops, or rubs. PMI is nondisplaced.   LUNGS: Clear to auscultation without crackles, rales, rhonchi, or wheezing. Normal percussion.   BACK:  No CVA or vertebral tenderness.   ABDOMEN: Bowel sounds are positive. Nontender, nondistended. No hepatosplenomegaly.   EXTREMITIES: No clubbing, cyanosis, or edema.  NEURO:  Cranial nerves II through XII are grossly intact.   SKIN: Without rash or lesions.   LABORATORY, DIAGNOSTIC, AND RADIOLOGICAL DATA:  Sodium 142, potassium 3.6, chloride 106, bicarbonate 28, BUN 11, creatinine 0.77, glucose 87, bilirubin 2.8, calcium 9, alkaline phosphatase 457, ALT 47, AST 73, total protein 6.5, albumin 2.7. White blood cells 6.3, hemoglobin 12.6, hematocrit 37, platelets 121. Lipase 311. Ammonia 92, INR 1.2. CT of the head shows no acute intracranial hemorrhage or cerebrovascular accident.   ASSESSMENT AND PLAN: This is a 69 year old female with a history of endstage liver disease secondary to hepatitis  C who presents with hepatic encephalopathy.  1. Hepatic encephalopathy on top of baseline dementia: The patient will be admitted to the medicine service. We will continue lactulose and rifaximin. Repeat ammonia level in the a.m. The patient does have dementia at baseline and she is oftentimes confused per her caregivers' history.   2. Endstage liver disease: We will continue the patient on spironolactone.  3. Depression: Continue Zoloft.  4. Dementia: Continue olanzapine.  5. CODE STATUS:  Right now is FULL CODE. I did try to call the patient's sister who is the power of attorney to verify code status.  However, I had to leave a message.   TIME SPENT: Approximately 55 minutes.  ____________________________ Janyth Contes. Juliene Pina, MD spm:bjt D: 01/27/2012 20:37:22 ET T: 01/28/2012 07:58:18 ET JOB#: [id number]  cc: [name] P. Juliene Pina, MD, <Dictator> Burley Saver, MD Janyth Contes [name] MD ELECTRONICALLY SIGNED 01/28/2012 14:25

## 2014-12-01 NOTE — Consult Note (Signed)
Brief Consult Note: Diagnosis: chronic hepatitis C, childs pugh class b-c cirrhosis, HE.   Patient was seen by consultant.   Consult note dictated.   Recommend further assessment or treatment.   Orders entered.   Comments: Please see full GI consult.  Consult placed for abdominal pain.  Patient currently with mild encephalopathy, mild asterixis, probable HE.  On lactulose and xifaxamin.  Minimal abdominal pain.  Only one bm the past 2 days. Patient with tipss noted on CT.  I am uncertain of curretn compliance with meds at home or if she has established with a Hepatologist since [place] from [place].  Her hepatitis exposure likely in the early to mid 1980's.  Lfts improved since admission except for bili increasing.  Will need to check flow in tipss and well as dopplers of hepatic art/vein and portal vein. Will increase lactulose.  Check alphafetoprotein.  Following.  Electronic Signatures: Barnetta ChapelSkulskie, Martin (MD)  (Signed 12-Jan-13 16:15)  Authored: Brief Consult Note   Last Updated: 12-Jan-13 16:15 by Barnetta ChapelSkulskie, Martin (MD)

## 2014-12-01 NOTE — Consult Note (Signed)
Chief Complaint:   Subjective/Chief Complaint ammonia normal on recheck today.  patietn refusing meds, allows examination, not communicative.   VITAL SIGNS/ANCILLARY NOTES: **Vital Signs.:   15-Jan-13 13:12   Vital Signs Type Q 4hr   Temperature Temperature (F) 98   Celsius 36.6   Pulse Pulse 85   Pulse source per Dinamap   Respirations Respirations 18   Systolic BP Systolic BP 112   Diastolic BP (mmHg) Diastolic BP (mmHg) 58   Mean BP 76   BP Source Dinamap   Pulse Ox % Pulse Ox % 94   Pulse Ox Activity Level  At rest   Oxygen Delivery Room Air/ 21 %   Brief Assessment:   Cardiac Regular    Respiratory clear BS    Gastrointestinal details normal Soft  Nontender  Nondistended  No masses palpable  Bowel sounds normal   Oncology:  12-Jan-13 16:50    AFP, Tumor Marker (Serial Monitoring) 2.5  Routine Chem:  13-Jan-13 04:53    Ammonia, Plasma 73  Hepatic:  15-Jan-13 13:59    Bilirubin, Total 2.4   Alkaline Phosphatase 82   SGPT (ALT) 33   SGOT (AST) 61   Total Protein, Serum 5.3   Albumin, Serum 2.3  Routine Chem:  15-Jan-13 13:59    Ammonia, Plasma 29  Routine Coag:  15-Jan-13 13:59    Prothrombin 18.1   INR 1.5  Hepatic:  15-Jan-13 13:59    Bilirubin, Direct 1.2   Assessment/Plan:  Assessment/Plan:   Assessment 1) childs pugh class b-c cirrhosis history of hepatitis c and remote substance abuse.  ammonia normalized.  On appropriate meds, however refusing.  Appreciate psychiatry assistance.    Plan continue current, consider palliative care consult.   Electronic Signatures: Barnetta ChapelSkulskie, Terrisa Curfman (MD)  (Signed 15-Jan-13 17:19)  Authored: Chief Complaint, VITAL SIGNS/ANCILLARY NOTES, Brief Assessment, Lab Results, Assessment/Plan   Last Updated: 15-Jan-13 17:19 by Barnetta ChapelSkulskie, Kimari Coudriet (MD)

## 2014-12-01 NOTE — Consult Note (Signed)
Chief Complaint:   Subjective/Chief Complaint patient declining parts of exam.  disoriented, delusional.  no apparent pain.  no emesis.  had 2 bm since yesterday.   VITAL SIGNS/ANCILLARY NOTES: **Vital Signs.:   16-Jan-13 02:23   Vital Signs Type Q 4hr   Temperature Temperature (F) 97.3   Celsius 36.2   Temperature Source axillary   Pulse Pulse 89   Pulse source per Dinamap   Respirations Respirations 20   Systolic BP Systolic BP 166   Diastolic BP (mmHg) Diastolic BP (mmHg) 66   Mean BP 99   BP Source Dinamap   Pulse Ox % Pulse Ox % 100   Pulse Ox Activity Level  At rest   Oxygen Delivery Room Air/ 21 %    05:13   Vital Signs Type Q 4hr   Temperature Temperature (F) 97.6   Celsius 36.4   Temperature Source axillary   Pulse Pulse 90   Pulse source per Dinamap   Respirations Respirations 20   Systolic BP Systolic BP 142   Diastolic BP (mmHg) Diastolic BP (mmHg) 59   Mean BP 86   BP Source Dinamap   Pulse Ox % Pulse Ox % 98   Pulse Ox Activity Level  At rest   Oxygen Delivery Room Air/ 21 %  *Intake and Output.:   16-Jan-13 02:58   Stool  Lrge amount of liquid stool on bottom sheets.    11:54   Stool  Small amount on bed   Brief Assessment:   Cardiac Regular    Respiratory clear BS    Additional Physical Exam patient declined abdominal exam, but denied any abdominal pain   Routine Chem:  11-Jan-13 18:13    Ammonia, Plasma 43  13-Jan-13 04:53    Ammonia, Plasma 73  15-Jan-13 13:59    Ammonia, Plasma 29   Assessment/Plan:  Assessment/Plan:   Assessment 1) altered mental status.  ammonia is now normal, mental status not improved. 2) chronic hepatitis C.  patietn refusing some medicines, labs and examination.  Of note alpha fetoprotein was in normal range.    Plan 1) continue current.  no new recommendations.   Electronic Signatures: Barnetta ChapelSkulskie, Martin (MD)  (Signed 16-Jan-13 14:54)  Authored: Chief Complaint, VITAL SIGNS/ANCILLARY NOTES, Brief Assessment,  Lab Results, Assessment/Plan   Last Updated: 16-Jan-13 14:54 by Barnetta ChapelSkulskie, Martin (MD)

## 2014-12-01 NOTE — H&P (Signed)
PATIENT NAME:  Cindy Leon, MULLENS MR#:  [id number] DATE OF BIRTH:  1946-02-17  DATE OF ADMISSION:  08/19/2011  CHIEF COMPLAINT: Abdominal pain, confusion, depression.   HISTORY OF PRESENT ILLNESS: The patient is a 69 year old female with history of hepatitis C who presents with chief complaint of abdominal pain for the last six months. Over the last couple of weeks, the patient has been increasingly confused. She reports being depressed and feels hopeless. She reports that her sister's son has cancer and she is concerned about that. She has been having hallucinations where she sees people that are not in the room with her. She denies any suicidal ideations. She denies any homicidal ideations. She reports poor appetite. She denies any nausea or vomiting. She denies fevers or chills. She denies any other aggravating or alleviating factors.   PAST MEDICAL HISTORY:  1. Cervical cancer. 2. Hepatitis C. 3. Hepatic encephalopathy.  4. Cervix removal.   ALLERGIES: Amoxicillin, latex.   CURRENT MEDICATIONS:  1. Calcium carbonate 500 mg p.o. b.i.d.  2. Celexa 10 mg p.o. daily.  3. Oxybutynin 5 mg p.o. b.i.d.  4. Spironolactone 25 mg p.o. daily.  5. Vitamin D3 2,000 international units p.o. daily. 6. Xifaxan 550 mg p.o. b.i.d.  7. Zantac 150 mg p.o. b.i.d.   SOCIAL HISTORY: The patient denies tobacco abuse, alcohol abuse, drug abuse.   FAMILY HISTORY: The patient's brother is in his 75s and has diabetes. Father died at 35 years old, had cancer. The patient's mother is deceased.   REVIEW OF SYSTEMS: CONSTITUTIONAL: The patient denies any fevers, chills, night sweats. HEENT: The patient denies any hearing loss, dysphagia, visual problems or sore throat. CARDIOVASCULAR: The patient denies any chest pain, orthopnea, or PND. RESPIRATORY: The patient denies any cough, wheezing, or hemoptysis. GI: The patient denies any nausea, vomiting, hematemesis, hematochezia, melena. GU: The patient denies any  hematuria, dysuria, or frequency. NEUROLOGIC: The patient denies any headache, focal weakness, or seizures. SKIN: The patient denies any lesions or rash. ENDOCRINE: The patient denies polyuria, polyphagia, or polydipsia. MUSCULOSKELETAL: The patient denies any arthritis, joint effusion, or swelling. HEMATOLOGIC: The patient denies any easy bleeding or bruises.   PHYSICAL EXAMINATION:  VITAL SIGNS: Temperature 98.1, heart rate 70, respiratory 16, blood pressure 130/60, oxygen sats 100%.    HEENT: Atraumatic, normocephalic. Pupils equal, round, and reactive to light and accommodation. Extraocular movements intact. Sclerae anicteric. Mucous membranes dry.   NECK: Supple. No organomegaly.   CARDIOVASCULAR: S1, S2, regular rate, rhythm. No gallops. No thrills. No murmurs.   RESPIRATORY: Lungs are clear to auscultation. No rales, no rhonchi, no wheezes, no bronchial breath sounds.   GI: Abdomen is soft, nontender, nondistended. Normal bowel sounds. No hepatosplenomegaly.   GU: There is no hematuria or masses noted.   SKIN: No lesions, no rash.   ENDOCRINE: No masses, no thyromegaly.   LYMPH: No lymphadenopathy or nodes palpable.   NEUROLOGIC: Cranial nerves II through XII grossly intact. Motor strength is five out of five bilateral upper and lower extremities. Sensation is within normal limits. No focal neurological deficits noted on examination.   MUSCULOSKELETAL: No arthritis, joint effusion, or swelling.   HEMATOLOGIC: No ecchymosis, no bleeding, no petechiae.   LABORATORY, RADIOLOGICAL AND DIAGNOSTIC DATA: WBC count 5,000, hemoglobin 14, hematocrit 40.7, platelet count was 104, MCV 109. Glucose 73, BUN 12, creatinine 0.61, sodium 141, potassium 4.1, chloride 103, CO2 30, calcium 9.9, total bilirubin 3.4, alkaline phosphatase 183, ALT 46, AST 81, total protein 7.1, albumin 3.4. Estimated  GFR greater than 60. Troponin I 0.02. Plasma ammonia level 40. Ethanol less than 3. Urinalysis shows 13  WBCs. Toxicology screen is negative.   ASSESSMENT AND PLAN:  1. The patient is a 69 year old female who presents with chief complaint of confusion. She has abdominal pain. CT scan of the abdomen and pelvis is negative. CT of the brain is negative. She has a urinary tract infection. Will admit the patient to medical unit. Start IV fluids, IV Rocephin. Check urine cultures. Urinary tract infection is the likely cause for the confusion.  2. Hepatic encephalopathy. Continue Spironolactone.  3. Gastroesophageal reflux disease. Continue Zantac.  4. Depression. Continue Celexa.  5. Thrombocytopenia, likely cause of his chronic liver disease. Monitor platelet count closely.   ____________________________ Donia AstJignesh S. Wandalene Abrams, MD jsp:ap D: 08/20/2011 00:22:05 ET T: 08/20/2011 07:20:51 ET JOB#: [id number]  cc: Donia AstJignesh S. Kethan Papadopoulos, MD, <Dictator> Donia AstJIGNESH S Kaidyn Hernandes MD ELECTRONICALLY SIGNED 08/20/2011 8:30

## 2014-12-01 NOTE — Consult Note (Signed)
Brief Consult Note: Diagnosis: depression nos.   Patient was seen by consultant.   Recommend further assessment or treatment.   Comments: Psychiatry: Due to a mis-communication I was never informed of this consult last week. I was unaware there was any expectation that I see this patient until last night. I came bby to speak with her today and she basicly refused to talk. She made zero eye contact, whispered a few things and then told me to go away, she wouldn't talk to me.  I'll review the chart and see if I can get any collateral information and will follow up. Will dictate consult when I get a little more information.  Electronic Signatures: Audery Amellapacs, Karthikeya Funke T (MD)  (Signed 15-Jan-13 12:35)  Authored: Brief Consult Note   Last Updated: 15-Jan-13 12:35 by Audery Amellapacs, Corydon Schweiss T (MD)

## 2014-12-01 NOTE — Consult Note (Signed)
Chief Complaint:   Subjective/Chief Complaint sedated, minimal interaction with examiner.  will not answer questions.   VITAL SIGNS/ANCILLARY NOTES: **Vital Signs.:   17-Jan-13 06:00   Vital Signs Type Q 4hr   Temperature Temperature (F) 98.1   Celsius 36.7   Temperature Source oral   Pulse Pulse 92   Pulse source per Dinamap   Respirations Respirations 20   Systolic BP Systolic BP 115   Diastolic BP (mmHg) Diastolic BP (mmHg) 69   Mean BP 84   Pulse Ox % Pulse Ox % 95   Pulse Ox Activity Level  At rest   Oxygen Delivery Room Air/ 21 %  *Intake and Output.:   17-Jan-13 06:55   Stool  moderate light green liquid stool   Brief Assessment:   Cardiac Regular    Respiratory clear BS    Gastrointestinal details normal Soft  Nontender  Nondistended  No masses palpable  Bowel sounds normal   Routine Chem:  11-Jan-13 18:13    Ammonia, Plasma 43  13-Jan-13 04:53    Ammonia, Plasma 73  15-Jan-13 13:59    Ammonia, Plasma 29  Routine Hem:  17-Jan-13 12:51    WBC (CBC) 5.3   RBC (CBC) 3.59   Hemoglobin (CBC) 13.5   Hematocrit (CBC) 39.5   Platelet Count (CBC) 92   MCV 110   MCH 37.6   MCHC 34.2   RDW 14.3  Routine Chem:  17-Jan-13 12:51    Ammonia, Plasma 38  Routine Hem:  17-Jan-13 12:51    Neutrophil % 66.5   Lymphocyte % 20.3   Monocyte % 11.0   Eosinophil % 1.9   Basophil % 0.3   Neutrophil # 3.5   Lymphocyte # 1.1   Monocyte # 0.6   Eosinophil # 0.1   Basophil # 0.0   Assessment/Plan:  Assessment/Plan:   Assessment 1) chronic hepatitis C 2) hepatic encephalopathy-refused treatment meds yesterday, ammonia some higher today. 3)ams-Most of sx likely related to baseline psych disorder/meds, with intermittant superimposition of metabolic encephalopathy due to liver disease/hyperammonemia.    Plan 1) continue current, discussed with Dr Luberta MutterKonidena, consider palliative care consult.   Electronic Signatures: Barnetta ChapelSkulskie, Ashelyn Mccravy (MD)  (Signed 17-Jan-13  15:39)  Authored: Chief Complaint, VITAL SIGNS/ANCILLARY NOTES, Brief Assessment, Lab Results, Assessment/Plan   Last Updated: 17-Jan-13 15:39 by Barnetta ChapelSkulskie, Enaya Howze (MD)

## 2014-12-01 NOTE — Consult Note (Signed)
Chief Complaint:   Subjective/Chief Complaint 2 bm today, but refusing lactulose.  denies abdominal pain or nausea, took lactulose with alot of encouragement.  more disoriented today than yesterday.   VITAL SIGNS/ANCILLARY NOTES: **Vital Signs.:   14-Jan-13 14:30   Vital Signs Type Routine   Temperature Temperature (F) 98.2   Celsius 36.7   Temperature Source oral   Pulse Pulse 89   Pulse source per Dinamap   Respirations Respirations 18   Systolic BP Systolic BP 102   Diastolic BP (mmHg) Diastolic BP (mmHg) 64   Mean BP 76   BP Source Dinamap   Pulse Ox % Pulse Ox % 94   Pulse Ox Activity Level  At rest   Oxygen Delivery Room Air/ 21 %  *Intake and Output.:   14-Jan-13 11:59   Stool  medium loose stool    16:30   Stool  1, small loose    17:23   Stool  1, moderate amount of very loose stool   Brief Assessment:   Cardiac Regular    Respiratory clear BS    Gastrointestinal details normal Soft  Nontender  Nondistended  No masses palpable  Bowel sounds normal   Oncology:  12-Jan-13 16:50    AFP, Tumor Marker (Serial Monitoring) 2.5   Assessment/Plan:  Assessment/Plan:   Assessment 1) childs pugh class b-c cirrhosis. 2) hepatic encephalopathy-currently on lactulose when she will take it, on xifaxan.   3) chronic hepatitis C-alpha fetoprotein normal    Plan 1) continue current.  following. no new recs.  will ask for daily weights and strict I/O   Electronic Signatures: Barnetta ChapelSkulskie, Martin (MD)  (Signed 14-Jan-13 17:46)  Authored: Chief Complaint, VITAL SIGNS/ANCILLARY NOTES, Brief Assessment, Lab Results, Assessment/Plan   Last Updated: 14-Jan-13 17:46 by Barnetta ChapelSkulskie, Martin (MD)

## 2014-12-01 NOTE — Consult Note (Signed)
Chief Complaint:   Subjective/Chief Complaint no n/v or abdominal pain.  orientation/ams about the same as yesterday.   VITAL SIGNS/ANCILLARY NOTES: **Vital Signs.:   13-Jan-13 09:57   Vital Signs Type Q 4hr   Temperature Temperature (F) 97.6   Celsius 36.4   Temperature Source oral   Pulse Pulse 67   Pulse source per Dinamap   Respirations Respirations 18   Systolic BP Systolic BP 104   Diastolic BP (mmHg) Diastolic BP (mmHg) 62   Mean BP 76   BP Source Dinamap   Pulse Ox % Pulse Ox % 94   Pulse Ox Activity Level  At rest   Oxygen Delivery Room Air/ 21 %  *Intake and Output.:   13-Jan-13 12:16   Stool  very small amount loose stool   Brief Assessment:   Cardiac Regular    Respiratory clear BS    Gastrointestinal details normal Soft  Nontender  Nondistended  No masses palpable  Bowel sounds normal   Hepatic:  13-Jan-13 04:53    Bilirubin, Total 1.8   Alkaline Phosphatase 117   SGPT (ALT) 27   SGOT (AST) 50   Total Protein, Serum 4.7   Albumin, Serum 2.1  Routine Chem:  13-Jan-13 04:53    Ammonia, Plasma 73  Routine Coag:  13-Jan-13 04:53    Prothrombin 18.7   INR 1.5  Hepatic:  13-Jan-13 04:53    Bilirubin, Direct 0.90   Radiology Results: [place]:    13-Jan-13 09:30, US Abdomen General Survey   US Abdomen General Survey    REASON FOR EXAM:    hepatitis C, please check dopplers of portal splenic   and hepatic veins and the f  COMMENTS:       PROCEDURE: [place]  - US ABDOMEN GENERAL SURVEY  - Aug 22 2011  9:30AM     RESULT: Comparison: CT of the abdomen and pelvis 08/19/2011    Technique: Multiple grayscale, color Doppler, and spectral Doppler images   were attained of the abdomen.    Findings:  The pancreas is partially obscured by overlying bowel gas. Multiple   anechoic structures are seen in the liver, consistent with cysts. 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 Flow within the main portal vein is in     normal hepatopedal direction. There is flow  within the TIPS shunt. The   velocity of flow in the TIPS shunt is 130 cm/sec. Recommend comparison   with flow measurements on outside prior ultrasound. Flow is identified   within the hepatic veins. Flow is seen within the splenic vein.    The gallbladder wall is thickened, measuring 5 mm in thickness. No   gallstones are identified. The sonographic Murphy's sign was negative.   The common bile duct measures 3 mm. There is a small echogenic focus   adjacent to the gallbladder fossa. This is of uncertain etiology, but   likely related to the punctate calcification or focus of metal in this   region on the prior CT. This is in the region of the inferior portion of   the TIPS stent. The spleen is at the upper limits of normal in size,   measuring 4.5 cm.    Images of the kidneys and no hydronephrosis. There is a left renal cyst     measuring 3.1 cm in greatest dimension.    IMPRESSION:   1. Flow is identified within the hepatic veins, TIPS shunt, portal vein,   and splenic vein. Flow  in the main portal vein is normal hepatopedal.  2. Gallbladder wall thickening is nonspecific. The sonographic sign was  negative. Gallbladder wall thickening can be secondary to hepatitis,   cirrhosis, hypoproteinemia, and cholecystitis, among others. Correlate   clinically.          Verified By: Lewie ChamberOBERT L. SUBER, M.D., MD   Assessment/Plan:  Assessment/Plan:   Assessment 1) childs pugh class b-c cirrhosis, secondary to chronic hep c, remote etoh abuse. patient with mild coagulopathy, hypoalbuminemia, hyperammonemia/HE.  mild asterixis. Abd us shows flow through tipss.    Plan 1) discussed with Dr Dava NajjarPanwar, to have lactulose increased to 60 cc tid/qid, continue xifaxan.  continue current.   Electronic Signatures: Barnetta ChapelSkulskie, Kody Vigil (MD)  (Signed 13-Jan-13 13:38)  Authored: Chief Complaint, VITAL SIGNS/ANCILLARY NOTES, Brief Assessment, Lab Results, Radiology Results, Assessment/Plan   Last Updated:  13-Jan-13 13:38 by Barnetta ChapelSkulskie, Dencil Cayson (MD)
